# Patient Record
Sex: Male | Born: 1980
Health system: Southern US, Community
[De-identification: ages and names within clinical notes are randomized; demographics above are authoritative.]

## PROBLEM LIST (undated history)

## (undated) DIAGNOSIS — M549 Dorsalgia, unspecified: Secondary | ICD-10-CM

## (undated) DIAGNOSIS — G8929 Other chronic pain: Secondary | ICD-10-CM

## (undated) HISTORY — PX: NO PAST SURGERIES: SHX2092

---

## 2012-03-18 ENCOUNTER — Ambulatory Visit (INDEPENDENT_AMBULATORY_CARE_PROVIDER_SITE_OTHER): Payer: 59 | Admitting: Emergency Medicine

## 2012-03-18 VITALS — BP 118/82 | HR 107 | Temp 98.6°F | Resp 16 | Ht 73.0 in | Wt 214.0 lb

## 2012-03-18 DIAGNOSIS — M5430 Sciatica, unspecified side: Secondary | ICD-10-CM

## 2012-03-18 MED ORDER — NAPROXEN SODIUM 550 MG PO TABS: 550.0000 mg | ORAL_TABLET | Freq: Two times a day (BID) | ORAL | Status: DC

## 2012-03-18 MED ORDER — TRAMADOL HCL 50 MG PO TABS: 50.0000 mg | ORAL_TABLET | Freq: Four times a day (QID) | ORAL | Status: AC | PRN

## 2012-03-18 NOTE — Patient Instructions (Signed)

## 2012-03-18 NOTE — Progress Notes (Signed)
   Date:  03/18/2012   Name:  Lance Moreno   DOB:  06-Jun-1981   MRN:  [id number] Gender: male Age: 31 y.o.  PCP:  No primary provider on file.    Chief Complaint: Back Pain   History of Present Illness:  Lance Moreno is a 31 y.o. pleasant patient who presents with the following:  4 month duration pain in low back and left buttock.  Says no history of injury or overuse.  Pain constant and unrelenting.  Worse now than previously.  Says pain radiates into the left hip laterally.  No paresthesias or weakness, no radiation of pain in leg.  Worse with bending, sneezing and coughing.  No factors improve pain.  Never evaluated.   There is no problem list on file for this patient.   No past medical history on file.  No past surgical history on file.  History  Substance Use Topics  . Smoking status: Current Everyday Smoker    Types: Cigarettes  . Smokeless tobacco: Not on file   Comment: 1 pack a week  . Alcohol Use: Not on file    No family history on file.  No Known Allergies  Medication list has been reviewed and updated.  No current outpatient prescriptions on file prior to visit.    Review of Systems:  As per HPI, otherwise negative.    Physical Examination: Filed Vitals:   03/18/12 1052  BP: 118/82  Pulse: 107  Temp: 98.6 F (37 C)  Resp: 16   Filed Vitals:   03/18/12 1052  Height: 6\' 1"  (1.854 m)  Weight: 214 lb (97.07 kg)   Body mass index is 28.23 kg/(m^2). Ideal Body Weight: Weight in (lb) to have BMI = 25: 189.1    GEN: WDWN, NAD, Non-toxic, Alert & Oriented x 3 HEENT: Atraumatic, Normocephalic.  Ears and Nose: No external deformity. EXTR: No clubbing/cyanosis/edema Back:  Tender left sciatic notch. NEURO: Normal gait.  DTR's lower ext 2+ PSYCH: Normally interactive. Conversant. Not depressed or anxious appearing.  Calm demeanor.    Assessment and Plan: Sciatic neuralgia Anaprox Ultram Follow up in two weeks  If still having pain  will likely do an MRI  Carmelina Dane, MD

## 2012-03-19 ENCOUNTER — Emergency Department (HOSPITAL_COMMUNITY)
Admission: EM | Admit: 2012-03-19 | Discharge: 2012-03-19 | Discharge status: Against Medical Advice | Payer: 59 | Attending: Emergency Medicine | Admitting: Emergency Medicine

## 2012-03-19 DIAGNOSIS — Z0389 Encounter for observation for other suspected diseases and conditions ruled out: Secondary | ICD-10-CM | POA: Insufficient documentation

## 2012-04-12 ENCOUNTER — Other Ambulatory Visit: Payer: Self-pay | Admitting: Internal Medicine

## 2012-04-12 DIAGNOSIS — M545 Low back pain: Secondary | ICD-10-CM

## 2012-04-16 ENCOUNTER — Other Ambulatory Visit: Payer: 59

## 2012-04-17 ENCOUNTER — Ambulatory Visit
Admission: RE | Admit: 2012-04-17 | Discharge: 2012-04-17 | Disposition: A | Discharge status: Home / Self Care | Payer: 59 | Source: Ambulatory Visit | Attending: Internal Medicine | Admitting: Internal Medicine

## 2012-04-17 DIAGNOSIS — M545 Low back pain: Secondary | ICD-10-CM

## 2012-09-21 ENCOUNTER — Ambulatory Visit (INDEPENDENT_AMBULATORY_CARE_PROVIDER_SITE_OTHER): Payer: 59 | Admitting: Internal Medicine

## 2012-09-21 ENCOUNTER — Ambulatory Visit: Payer: 59

## 2012-09-21 VITALS — BP 113/80 | HR 100 | Temp 99.5°F | Resp 18 | Wt 207.0 lb

## 2012-09-21 DIAGNOSIS — M25559 Pain in unspecified hip: Secondary | ICD-10-CM

## 2012-09-21 DIAGNOSIS — G8929 Other chronic pain: Secondary | ICD-10-CM

## 2012-09-21 DIAGNOSIS — M25552 Pain in left hip: Secondary | ICD-10-CM

## 2012-09-21 MED ORDER — CYCLOBENZAPRINE HCL 10 MG PO TABS: 10.0000 mg | ORAL_TABLET | Freq: Every day | ORAL | Status: DC

## 2012-09-21 MED ORDER — PREDNISONE 20 MG PO TABS: ORAL_TABLET | ORAL | Status: DC

## 2012-09-21 MED ORDER — MELOXICAM 15 MG PO TABS: 15.0000 mg | ORAL_TABLET | Freq: Every day | ORAL | Status: DC

## 2012-09-21 NOTE — Progress Notes (Signed)
  Subjective:    Patient ID: Lance Moreno, male    DOB: 07-15-1981, 32 y.o.   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 No prior hip injuries or back injuries    Review of Systems No fever chills or night sweats No urinary problems No muscle atrophy No diarrhea or constipation    Objective:   Physical Exam Vital signs stable Nontender to palpation of the lumbar area/spine straight without muscle spasm Is tender deep in the left buttock along the ishial tuberosity Chest pain with range of motion of the left hip in particular external rotation/nontender over the trochanteric bursa/nontender over the SI joint Straight leg raise to 90 produces pain in the hip and not the low back This started last August when he was in a running program and had acute pain in the left buttock He improved but Reinjuring himself     [facility] reading (PRIMARY) by  Dr. Merla Riches no fracture/no chronic changes/no avulsion  injuries   Assessment & Plan:  Problem #1 left hip pain/left buttock pain--chronic recurrent  ? Piriformis syndrome/? Sacroiliitis/? 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 predniSONE (DELTASONE) 20 MG tablet    Sig: 4/3/3/3/2/2/2/1/1/1single daily dose for 10days    Dispense:  22 tablet    Refill:  0  . cyclobenzaprine (FLEXERIL) 10 MG tablet    Sig: Take 1 tablet (10 mg total) by mouth at bedtime.    Dispense:  30 tablet    Refill:   0  . meloxicam (MOBIC) 15 MG tablet    Sig: Take 1 tablet (15 mg total) by mouth daily.    Dispense:  30 tablet    Refill:  0   Call in 10 days if not improving for referral to orthopedics and consideration of MRI At this point he has no time for physical therapy

## 2012-10-15 ENCOUNTER — Other Ambulatory Visit: Payer: Self-pay | Admitting: Internal Medicine

## 2012-10-15 DIAGNOSIS — M5430 Sciatica, unspecified side: Secondary | ICD-10-CM

## 2012-10-16 ENCOUNTER — Ambulatory Visit
Admission: RE | Admit: 2012-10-16 | Discharge: 2012-10-16 | Disposition: A | Discharge status: Home / Self Care | Payer: 59 | Source: Ambulatory Visit | Attending: Internal Medicine | Admitting: Internal Medicine

## 2012-10-16 DIAGNOSIS — M5430 Sciatica, unspecified side: Secondary | ICD-10-CM

## 2012-10-16 MED ORDER — IOHEXOL 180 MG/ML  SOLN
1.0000 mL | Freq: Once | INTRAMUSCULAR | Status: AC | PRN
  Administered 2012-10-16: 1 mL via EPIDURAL

## 2012-10-16 MED ORDER — METHYLPREDNISOLONE ACETATE 40 MG/ML INJ SUSP (RADIOLOG
120.0000 mg | Freq: Once | INTRAMUSCULAR | Status: AC
  Administered 2012-10-16: 120 mg via EPIDURAL

## 2012-10-28 ENCOUNTER — Other Ambulatory Visit: Payer: Self-pay | Admitting: Internal Medicine

## 2012-10-28 DIAGNOSIS — M549 Dorsalgia, unspecified: Secondary | ICD-10-CM

## 2012-10-31 ENCOUNTER — Ambulatory Visit
Admission: RE | Admit: 2012-10-31 | Discharge: 2012-10-31 | Disposition: A | Discharge status: Home / Self Care | Payer: 59 | Source: Ambulatory Visit | Attending: Internal Medicine | Admitting: Internal Medicine

## 2012-10-31 DIAGNOSIS — M549 Dorsalgia, unspecified: Secondary | ICD-10-CM

## 2012-10-31 MED ORDER — IOHEXOL 180 MG/ML  SOLN
1.0000 mL | Freq: Once | INTRAMUSCULAR | Status: AC | PRN
  Administered 2012-10-31: 1 mL via EPIDURAL

## 2012-10-31 MED ORDER — METHYLPREDNISOLONE ACETATE 40 MG/ML INJ SUSP (RADIOLOG
120.0000 mg | Freq: Once | INTRAMUSCULAR | Status: AC
  Administered 2012-10-31: 120 mg via EPIDURAL

## 2012-11-18 ENCOUNTER — Other Ambulatory Visit: Payer: Self-pay | Admitting: Internal Medicine

## 2012-11-18 DIAGNOSIS — M545 Low back pain: Secondary | ICD-10-CM

## 2012-11-20 ENCOUNTER — Ambulatory Visit
Admission: RE | Admit: 2012-11-20 | Discharge: 2012-11-20 | Disposition: A | Discharge status: Home / Self Care | Payer: 59 | Source: Ambulatory Visit | Attending: Internal Medicine | Admitting: Internal Medicine

## 2012-11-20 DIAGNOSIS — M545 Low back pain: Secondary | ICD-10-CM

## 2012-12-02 ENCOUNTER — Other Ambulatory Visit: Payer: Self-pay | Admitting: Internal Medicine

## 2012-12-02 DIAGNOSIS — M5030 Other cervical disc degeneration, unspecified cervical region: Secondary | ICD-10-CM

## 2012-12-05 ENCOUNTER — Ambulatory Visit
Admission: RE | Admit: 2012-12-05 | Discharge: 2012-12-05 | Disposition: A | Discharge status: Home / Self Care | Payer: 59 | Source: Ambulatory Visit | Attending: Internal Medicine | Admitting: Internal Medicine

## 2012-12-05 DIAGNOSIS — M5030 Other cervical disc degeneration, unspecified cervical region: Secondary | ICD-10-CM

## 2012-12-05 MED ORDER — IOHEXOL 180 MG/ML  SOLN
1.0000 mL | Freq: Once | INTRAMUSCULAR | Status: AC | PRN
  Administered 2012-12-05: 1 mL via EPIDURAL

## 2012-12-05 MED ORDER — METHYLPREDNISOLONE ACETATE 40 MG/ML INJ SUSP (RADIOLOG
120.0000 mg | Freq: Once | INTRAMUSCULAR | Status: AC
  Administered 2012-12-05: 120 mg via EPIDURAL

## 2012-12-10 ENCOUNTER — Other Ambulatory Visit (HOSPITAL_COMMUNITY): Payer: Self-pay | Admitting: Orthopaedic Surgery

## 2012-12-18 NOTE — Pre-Procedure Instructions (Addendum)
Lance Moreno  12/18/2012   Your procedure is scheduled on:  Friday, June 13th  Report to [facility] at 0530 AM. Come to main entrance "A" and go to east elevators up to 3rd floor. Check in at short stay desk.  Call this number if you have problems the morning of surgery: [phone]   Remember:   Do not eat food or drink liquids after midnight.   Take these medicines the morning of surgery with A SIP OF WATER: ultram if needed Stop taking Aspirin, Coumadin, Plavix, Effient, and herbal medications. Do not take any NSAIDs ie: Ibuprofen, Advil, Naproxen or any medication containing Aspirin.  Do not wear jewelry.  Do not wear lotions, powders, or perfume,deodorant.  Do not shave 48 hours prior to surgery. Men may shave face and neck.  Do not bring valuables to the hospital.  [facility] is not responsible   for any belongings or valuables.  Contacts, dentures or bridgework may not be worn into surgery.  Leave suitcase in the car. After surgery it may be brought to your room.  For patients admitted to the hospital, checkout time is 11:00 AM the day of discharge.   Patients discharged the day of surgery will not be allowed to drive home.    Special Instructions: Shower using CHG 2 nights before surgery and the night before surgery.  If you shower the day of surgery use CHG.  Use special wash - you have one bottle of CHG for all showers.  You should use approximately 1/3 of the bottle for each shower.   Please read over the following fact sheets that you were given: Pain Booklet, Coughing and Deep Breathing, MRSA Information and Surgical Site Infection Prevention

## 2012-12-19 ENCOUNTER — Encounter (HOSPITAL_COMMUNITY): Payer: Self-pay

## 2012-12-19 ENCOUNTER — Encounter (HOSPITAL_COMMUNITY)
Admission: RE | Admit: 2012-12-19 | Discharge: 2012-12-19 | Disposition: A | Discharge status: Home / Self Care | Payer: 59 | Source: Ambulatory Visit | Attending: Orthopaedic Surgery | Admitting: Orthopaedic Surgery

## 2012-12-19 DIAGNOSIS — Z01812 Encounter for preprocedural laboratory examination: Secondary | ICD-10-CM | POA: Insufficient documentation

## 2012-12-19 DIAGNOSIS — Z01811 Encounter for preprocedural respiratory examination: Secondary | ICD-10-CM | POA: Insufficient documentation

## 2012-12-19 HISTORY — DX: Other chronic pain: G89.29

## 2012-12-19 HISTORY — DX: Dorsalgia, unspecified: M54.9

## 2012-12-19 LAB — CBC
HCT: 46.6 % (ref 39.0–52.0)
Hemoglobin: 15.9 g/dL (ref 13.0–17.0)
MCH: 30.7 pg (ref 26.0–34.0)
MCV: 90 fL (ref 78.0–100.0)
Platelets: 266 10*3/uL (ref 150–400)
RBC: 5.18 MIL/uL (ref 4.22–5.81)
WBC: 8.4 10*3/uL (ref 4.0–10.5)

## 2012-12-19 LAB — COMPREHENSIVE METABOLIC PANEL
BUN: 9 mg/dL (ref 6–23)
CO2: 27 mEq/L (ref 19–32)
Calcium: 9.7 mg/dL (ref 8.4–10.5)
Chloride: 102 mEq/L (ref 96–112)
Creatinine, Ser: 0.9 mg/dL (ref 0.50–1.35)
GFR calc Af Amer: >90 mL/min (ref >90)
GFR calc non Af Amer: >90 mL/min (ref >90)
Glucose, Bld: 96 mg/dL (ref 70–99)
Total Bilirubin: 0.7 mg/dL (ref 0.3–1.2)

## 2012-12-19 NOTE — Progress Notes (Signed)
Pt denies SOB, chest pain, and being under the care of a cardiologist.  

## 2012-12-23 NOTE — H&P (Signed)
[facility]   A Division of [employer] [facility]   [address], [place], [place] [address] Telephone: [phone]  Fax: [phone]     PATIENT: Salif, Tay   MR#: [id number]  DOB: Jul 22, 1980      CHIEF COMPLAINT: back pain and left leg pain and weakness.  HISTORY OF PRESENT ILLNESS:  A 32 year old male self-referred with severe back pain, disc herniation large with radiculopathy, calf weakness, inability to stand, pain that wakes him up at night and he is here for consideration for surgical intervention.  He has had 2 MRI scans, 1 on 04/17/2012 which showed large disc herniation with moderate central stenosis at L4-5 and large disc herniation L5-S1.  MRI scan a  year later done on 11/30/2012 showed large paracentral disc herniation with mass effect on the left side of the thecal sac and left L5 nerve root compression.  The disc is larger with greater mass effect.  Right L4 nerve is normal.  L5-S1 shows large disc protrusion with interval desiccation and slightly decrease in size but there is still mild-to-moderate mass affect on the thecal sac contracting both S1 nerve roots worse on the left than right.   Patient is normally followed by Dr. Rodrigo Ran.     CURRENT MEDICATIONS:  Tramadol.   ALLERGIES:  None.   PAST SURGICAL HISTORY:  No previous surgeries.   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   FAMILY HISTORY:  Noncontributory.   REVIEW OF SYSTEMS:  A 14-point review of systems is negative for rheumatologic conditions.   PHYSICAL EXAMINATION:  Patient is 6 feet 2 inches, 195 pounds.  He is in moderate distress.  He is ambulatory with inability to ambulate on his toes.  There is gastrocsoleus atrophy on the left only, none on the right.  He can do single stance toe raises along the right.  He fatigues out after  almost a single on the left.  Gastrocsoleus is weak to manual testing.  There is some plantar foot and less significant dorsal foot numbness.  Anterior EHL tib is weak on the left, normal on the right.  Gastrocsoleus is weak on the left, normal on the right.  Quads, abductors are normal.  Normal hip range of motion, positive popliteal compression test, positive straight leg raising at 45 degrees.   RADIOGRAPHS/TEST:  MRI scan is reviewed with him which shows large disc compression.     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  He has had epidurals x3, [facility], 20 chiropractic therapy visits.  He has been on pain medications, anti-inflammatories, muscle relaxants, prednisone Dosepak, meloxicam, Flexeril, tramadol,traction,  all without relief.   PLAN:  At this point, I discussed with him that I do not think further nonoperative treatment is likely to help.  Plan would be left L5-S1 and left 4-5 microdiscectomy.  The 4-5 is most likely the more severe level.  Procedure discussed with patient.  Overnight stay in hospital.  Postoperative walking program.  Risks of surgery discussed including recurrent disc herniation, reoperation, need for lateral fusion, infection, dural tear, spinal fluid leakage, damage to the great vessels, risk of DVT discussed.  All questions answered and he requested we proceed.   For additional information please see handwritten notes, reports, orders and prescriptions in this chart.      Loraine Leriche  Becky Sax, M.D.

## 2012-12-26 MED ORDER — CEFAZOLIN SODIUM-DEXTROSE 2-3 GM-% IV SOLR
2.0000 g | INTRAVENOUS | Status: AC
  Administered 2012-12-27: 2 g via INTRAVENOUS
  Filled 2012-12-26: qty 50

## 2012-12-26 MED ORDER — CHLORHEXIDINE GLUCONATE 4 % EX LIQD: 60.0000 mL | Freq: Once | CUTANEOUS | Status: DC

## 2012-12-27 ENCOUNTER — Encounter (HOSPITAL_COMMUNITY): Payer: Self-pay | Admitting: Anesthesiology

## 2012-12-27 ENCOUNTER — Ambulatory Visit (HOSPITAL_COMMUNITY): Payer: 59

## 2012-12-27 ENCOUNTER — Ambulatory Visit (HOSPITAL_COMMUNITY): Payer: 59 | Admitting: Anesthesiology

## 2012-12-27 ENCOUNTER — Encounter (HOSPITAL_COMMUNITY): Payer: Self-pay | Admitting: *Deleted

## 2012-12-27 ENCOUNTER — Observation Stay (HOSPITAL_COMMUNITY)
Admission: RE | Admit: 2012-12-27 | Discharge: 2012-12-28 | Disposition: A | Discharge status: Home / Self Care | Payer: 59 | Source: Ambulatory Visit | Attending: Orthopaedic Surgery | Admitting: Orthopaedic Surgery

## 2012-12-27 ENCOUNTER — Encounter (HOSPITAL_COMMUNITY)
Admission: RE | Disposition: A | Discharge status: Home / Self Care | Payer: Self-pay | Source: Ambulatory Visit | Attending: Orthopaedic Surgery

## 2012-12-27 DIAGNOSIS — F17200 Nicotine dependence, unspecified, uncomplicated: Secondary | ICD-10-CM | POA: Insufficient documentation

## 2012-12-27 DIAGNOSIS — M5126 Other intervertebral disc displacement, lumbar region: Principal | ICD-10-CM | POA: Diagnosis present

## 2012-12-27 HISTORY — PX: LUMBAR LAMINECTOMY: SHX95

## 2012-12-27 SURGERY — MICRODISCECTOMY LUMBAR LAMINECTOMY
Anesthesia: General | Site: Back | Laterality: Left | Wound class: Clean

## 2012-12-27 MED ORDER — SENNOSIDES-DOCUSATE SODIUM 8.6-50 MG PO TABS: 1.0000 | ORAL_TABLET | Freq: Every evening | ORAL | Status: DC | PRN

## 2012-12-27 MED ORDER — METHOCARBAMOL 100 MG/ML IJ SOLN
500.0000 mg | Freq: Four times a day (QID) | INTRAVENOUS | Status: DC | PRN
  Filled 2012-12-27: qty 5

## 2012-12-27 MED ORDER — ACETAMINOPHEN 650 MG RE SUPP: 650.0000 mg | RECTAL | Status: DC | PRN

## 2012-12-27 MED ORDER — SODIUM CHLORIDE 0.9 % IV SOLN: 250.0000 mL | INTRAVENOUS | Status: DC

## 2012-12-27 MED ORDER — MIDAZOLAM HCL 5 MG/5ML IJ SOLN
INTRAMUSCULAR | Status: DC | PRN
  Administered 2012-12-27: 2 mg via INTRAVENOUS

## 2012-12-27 MED ORDER — LACTATED RINGERS IV SOLN
INTRAVENOUS | Status: DC | PRN
  Administered 2012-12-27 (×2): via INTRAVENOUS

## 2012-12-27 MED ORDER — ONDANSETRON HCL 4 MG/2ML IJ SOLN: 4.0000 mg | Freq: Once | INTRAMUSCULAR | Status: DC | PRN

## 2012-12-27 MED ORDER — SODIUM CHLORIDE 0.9 % IJ SOLN: 3.0000 mL | Freq: Two times a day (BID) | INTRAMUSCULAR | Status: DC

## 2012-12-27 MED ORDER — LIDOCAINE HCL (CARDIAC) 20 MG/ML IV SOLN
INTRAVENOUS | Status: DC | PRN
  Administered 2012-12-27: 100 mg via INTRAVENOUS

## 2012-12-27 MED ORDER — MORPHINE SULFATE 2 MG/ML IJ SOLN
1.0000 mg | INTRAMUSCULAR | Status: DC | PRN
  Administered 2012-12-27 (×3): 2 mg via INTRAVENOUS
  Filled 2012-12-27 (×3): qty 1

## 2012-12-27 MED ORDER — DOCUSATE SODIUM 100 MG PO CAPS
100.0000 mg | ORAL_CAPSULE | Freq: Two times a day (BID) | ORAL | Status: DC
  Administered 2012-12-27 – 2012-12-28 (×2): 100 mg via ORAL
  Filled 2012-12-27 (×3): qty 1

## 2012-12-27 MED ORDER — OXYCODONE-ACETAMINOPHEN 5-325 MG PO TABS: 1.0000 | ORAL_TABLET | ORAL | Status: DC | PRN

## 2012-12-27 MED ORDER — OXYCODONE-ACETAMINOPHEN 5-325 MG PO TABS: 1.0000 | ORAL_TABLET | ORAL | Status: AC | PRN

## 2012-12-27 MED ORDER — BISACODYL 10 MG RE SUPP: 10.0000 mg | Freq: Every day | RECTAL | Status: DC | PRN

## 2012-12-27 MED ORDER — THROMBIN 20000 UNITS EX SOLR
CUTANEOUS | Status: AC
  Filled 2012-12-27: qty 20000

## 2012-12-27 MED ORDER — KETOROLAC TROMETHAMINE 30 MG/ML IJ SOLN
INTRAMUSCULAR | Status: AC
  Filled 2012-12-27: qty 1

## 2012-12-27 MED ORDER — TRAMADOL HCL 50 MG PO TABS: 50.0000 mg | ORAL_TABLET | Freq: Four times a day (QID) | ORAL | Status: DC | PRN

## 2012-12-27 MED ORDER — HYDROMORPHONE HCL PF 1 MG/ML IJ SOLN
INTRAMUSCULAR | Status: AC
  Filled 2012-12-27: qty 1

## 2012-12-27 MED ORDER — METHOCARBAMOL 500 MG PO TABS
500.0000 mg | ORAL_TABLET | Freq: Four times a day (QID) | ORAL | Status: DC | PRN
  Filled 2012-12-27: qty 1

## 2012-12-27 MED ORDER — METHOCARBAMOL 500 MG PO TABS: 500.0000 mg | ORAL_TABLET | Freq: Four times a day (QID) | ORAL | Status: AC | PRN

## 2012-12-27 MED ORDER — CEFAZOLIN SODIUM 1-5 GM-% IV SOLN
1.0000 g | Freq: Three times a day (TID) | INTRAVENOUS | Status: AC
  Administered 2012-12-27 – 2012-12-28 (×2): 1 g via INTRAVENOUS
  Filled 2012-12-27 (×2): qty 50

## 2012-12-27 MED ORDER — BUPIVACAINE HCL (PF) 0.25 % IJ SOLN
INTRAMUSCULAR | Status: AC
  Filled 2012-12-27: qty 30

## 2012-12-27 MED ORDER — SODIUM CHLORIDE 0.9 % IJ SOLN: 3.0000 mL | INTRAMUSCULAR | Status: DC | PRN

## 2012-12-27 MED ORDER — ACETAMINOPHEN 325 MG PO TABS: 650.0000 mg | ORAL_TABLET | ORAL | Status: DC | PRN

## 2012-12-27 MED ORDER — ONDANSETRON HCL 4 MG/2ML IJ SOLN: 4.0000 mg | INTRAMUSCULAR | Status: DC | PRN

## 2012-12-27 MED ORDER — HYDROCODONE-ACETAMINOPHEN 5-325 MG PO TABS: 1.0000 | ORAL_TABLET | ORAL | Status: DC | PRN

## 2012-12-27 MED ORDER — LIDOCAINE HCL 4 % MT SOLN
OROMUCOSAL | Status: DC | PRN
  Administered 2012-12-27: 4 mL via TOPICAL

## 2012-12-27 MED ORDER — MEPERIDINE HCL 25 MG/ML IJ SOLN: 6.2500 mg | INTRAMUSCULAR | Status: DC | PRN

## 2012-12-27 MED ORDER — KETOROLAC TROMETHAMINE 30 MG/ML IJ SOLN
30.0000 mg | Freq: Four times a day (QID) | INTRAMUSCULAR | Status: AC
  Administered 2012-12-27 – 2012-12-28 (×4): 30 mg via INTRAVENOUS
  Filled 2012-12-27 (×3): qty 1

## 2012-12-27 MED ORDER — OXYCODONE HCL 5 MG/5ML PO SOLN: 5.0000 mg | Freq: Once | ORAL | Status: DC | PRN

## 2012-12-27 MED ORDER — PANTOPRAZOLE SODIUM 40 MG IV SOLR
40.0000 mg | Freq: Every day | INTRAVENOUS | Status: DC
  Administered 2012-12-27: 40 mg via INTRAVENOUS
  Filled 2012-12-27 (×2): qty 40

## 2012-12-27 MED ORDER — ZOLPIDEM TARTRATE 5 MG PO TABS: 5.0000 mg | ORAL_TABLET | Freq: Every evening | ORAL | Status: DC | PRN

## 2012-12-27 MED ORDER — GLYCOPYRROLATE 0.2 MG/ML IJ SOLN
INTRAMUSCULAR | Status: DC | PRN
  Administered 2012-12-27: 0.6 mg via INTRAVENOUS

## 2012-12-27 MED ORDER — PROPOFOL 10 MG/ML IV BOLUS
INTRAVENOUS | Status: DC | PRN
  Administered 2012-12-27: 200 mg via INTRAVENOUS

## 2012-12-27 MED ORDER — MENTHOL 3 MG MT LOZG: 1.0000 | LOZENGE | OROMUCOSAL | Status: DC | PRN

## 2012-12-27 MED ORDER — PHENOL 1.4 % MT LIQD: 1.0000 | OROMUCOSAL | Status: DC | PRN

## 2012-12-27 MED ORDER — KCL IN DEXTROSE-NACL 20-5-0.45 MEQ/L-%-% IV SOLN
INTRAVENOUS | Status: DC
  Administered 2012-12-27: 16:00:00 via INTRAVENOUS
  Filled 2012-12-27 (×3): qty 1000

## 2012-12-27 MED ORDER — OXYCODONE HCL 5 MG PO TABS: 5.0000 mg | ORAL_TABLET | Freq: Once | ORAL | Status: DC | PRN

## 2012-12-27 MED ORDER — SUFENTANIL CITRATE 50 MCG/ML IV SOLN
INTRAVENOUS | Status: DC | PRN
  Administered 2012-12-27: 5 ug via INTRAVENOUS
  Administered 2012-12-27 (×3): 10 ug via INTRAVENOUS
  Administered 2012-12-27: 15 ug via INTRAVENOUS

## 2012-12-27 MED ORDER — HYDROMORPHONE HCL PF 1 MG/ML IJ SOLN
0.2500 mg | INTRAMUSCULAR | Status: DC | PRN
  Administered 2012-12-27 (×2): 0.5 mg via INTRAVENOUS

## 2012-12-27 MED ORDER — FLEET ENEMA 7-19 GM/118ML RE ENEM: 1.0000 | ENEMA | Freq: Once | RECTAL | Status: AC | PRN

## 2012-12-27 MED ORDER — ROCURONIUM BROMIDE 100 MG/10ML IV SOLN
INTRAVENOUS | Status: DC | PRN
  Administered 2012-12-27: 50 mg via INTRAVENOUS
  Administered 2012-12-27 (×2): 10 mg via INTRAVENOUS

## 2012-12-27 MED ORDER — NEOSTIGMINE METHYLSULFATE 1 MG/ML IJ SOLN
INTRAMUSCULAR | Status: DC | PRN
  Administered 2012-12-27: 5 mg via INTRAVENOUS

## 2012-12-27 MED ORDER — 0.9 % SODIUM CHLORIDE (POUR BTL) OPTIME
TOPICAL | Status: DC | PRN
  Administered 2012-12-27: 1000 mL

## 2012-12-27 MED ORDER — ONDANSETRON HCL 4 MG/2ML IJ SOLN
INTRAMUSCULAR | Status: DC | PRN
  Administered 2012-12-27: 4 mg via INTRAVENOUS

## 2012-12-27 SURGICAL SUPPLY — 47 items
BUR ROUND FLUTED 4 SOFT TCH (BURR) ×2 IMPLANT
CLOTH BEACON ORANGE TIMEOUT ST (SAFETY) ×2 IMPLANT
CORDS BIPOLAR (ELECTRODE) ×2 IMPLANT
COVER SURGICAL LIGHT HANDLE (MISCELLANEOUS) ×2 IMPLANT
DERMABOND ADVANCED (GAUZE/BANDAGES/DRESSINGS) ×1
DERMABOND ADVANCED .7 DNX12 (GAUZE/BANDAGES/DRESSINGS) ×1 IMPLANT
DRAPE MICROSCOPE LEICA (MISCELLANEOUS) ×2 IMPLANT
DRAPE PROXIMA HALF (DRAPES) ×4 IMPLANT
DRSG EMULSION OIL 3X3 NADH (GAUZE/BANDAGES/DRESSINGS) ×2 IMPLANT
DRSG MEPILEX BORDER 4X4 (GAUZE/BANDAGES/DRESSINGS) IMPLANT
DRSG MEPILEX BORDER 4X8 (GAUZE/BANDAGES/DRESSINGS) ×2 IMPLANT
DURAPREP 26ML APPLICATOR (WOUND CARE) ×2 IMPLANT
ELECT REM PT RETURN 9FT ADLT (ELECTROSURGICAL) ×2
ELECTRODE REM PT RTRN 9FT ADLT (ELECTROSURGICAL) ×1 IMPLANT
GLOVE BIOGEL PI IND STRL 7.5 (GLOVE) ×1 IMPLANT
GLOVE BIOGEL PI IND STRL 8 (GLOVE) ×1 IMPLANT
GLOVE BIOGEL PI INDICATOR 7.5 (GLOVE) ×1
GLOVE BIOGEL PI INDICATOR 8 (GLOVE) ×1
GLOVE ECLIPSE 7.0 STRL STRAW (GLOVE) ×2 IMPLANT
GLOVE ORTHO TXT STRL SZ7.5 (GLOVE) ×2 IMPLANT
GOWN PREVENTION PLUS LG XLONG (DISPOSABLE) IMPLANT
GOWN STRL NON-REIN LRG LVL3 (GOWN DISPOSABLE) ×6 IMPLANT
KIT BASIN OR (CUSTOM PROCEDURE TRAY) ×2 IMPLANT
KIT ROOM TURNOVER OR (KITS) ×2 IMPLANT
MANIFOLD NEPTUNE II (INSTRUMENTS) ×2 IMPLANT
NDL SUT .5 MAYO 1.404X.05X (NEEDLE) ×1 IMPLANT
NEEDLE HYPO 25GX1X1/2 BEV (NEEDLE) ×2 IMPLANT
NEEDLE MAYO TAPER (NEEDLE) ×1
NEEDLE SPNL 18GX3.5 QUINCKE PK (NEEDLE) ×2 IMPLANT
NS IRRIG 1000ML POUR BTL (IV SOLUTION) ×2 IMPLANT
PACK LAMINECTOMY ORTHO (CUSTOM PROCEDURE TRAY) ×2 IMPLANT
PAD ARMBOARD 7.5X6 YLW CONV (MISCELLANEOUS) ×4 IMPLANT
PATTIES SURGICAL .5 X.5 (GAUZE/BANDAGES/DRESSINGS) ×2 IMPLANT
PATTIES SURGICAL .75X.75 (GAUZE/BANDAGES/DRESSINGS) IMPLANT
SPONGE GAUZE 4X4 12PLY (GAUZE/BANDAGES/DRESSINGS) ×2 IMPLANT
SUT VIC AB 0 CT1 27 (SUTURE) ×1
SUT VIC AB 0 CT1 27XBRD ANBCTR (SUTURE) ×1 IMPLANT
SUT VIC AB 2-0 CT1 27 (SUTURE) ×1
SUT VIC AB 2-0 CT1 TAPERPNT 27 (SUTURE) ×1 IMPLANT
SUT VICRYL 0 TIES 12 18 (SUTURE) ×2 IMPLANT
SUT VICRYL 4-0 PS2 18IN ABS (SUTURE) IMPLANT
SUT VICRYL AB 2 0 TIES (SUTURE) ×2 IMPLANT
SYR 20ML ECCENTRIC (SYRINGE) IMPLANT
SYR CONTROL 10ML LL (SYRINGE) ×2 IMPLANT
TOWEL OR 17X24 6PK STRL BLUE (TOWEL DISPOSABLE) ×2 IMPLANT
TOWEL OR 17X26 10 PK STRL BLUE (TOWEL DISPOSABLE) ×2 IMPLANT
WATER STERILE IRR 1000ML POUR (IV SOLUTION) ×2 IMPLANT

## 2012-12-27 NOTE — Anesthesia Preprocedure Evaluation (Addendum)
Anesthesia Evaluation  Patient identified by MRN, date of birth, ID band Patient awake    Reviewed: Allergy & Precautions, H&P , NPO status   History of Anesthesia Complications Negative for: history of anesthetic complications  Airway Mallampati: II TM Distance: >3 FB Neck ROM: Full    Dental  (+) Teeth Intact   Pulmonary neg pulmonary ROS, Current Smoker,  Approx 3 cig/daily   Pulmonary exam normal       Cardiovascular negative cardio ROS  Rhythm:Regular Rate:Normal     Neuro/Psych negative psych ROS   GI/Hepatic negative GI ROS, Neg liver ROS,   Endo/Other  negative endocrine ROS  Renal/GU negative Renal ROS     Musculoskeletal negative musculoskeletal ROS (+)   Abdominal Normal abdominal exam  (+)   Peds  Hematology negative hematology ROS (+)   Anesthesia Other Findings   Reproductive/Obstetrics negative OB ROS                          Anesthesia Physical Anesthesia Plan  ASA: II  Anesthesia Plan: General   Post-op Pain Management:    Induction: Intravenous  Airway Management Planned: Oral ETT  Additional Equipment:   Intra-op Plan:   Post-operative Plan: Extubation in OR  Informed Consent: I have reviewed the patients History and Physical, chart, labs and discussed the procedure including the risks, benefits and alternatives for the proposed anesthesia with the patient or authorized representative who has indicated his/her understanding and acceptance.   Dental advisory given  Plan Discussed with: CRNA  Anesthesia Plan Comments:        Anesthesia Quick Evaluation

## 2012-12-27 NOTE — Anesthesia Procedure Notes (Signed)
Procedure Name: Intubation Date/Time: 12/27/2012 7:33 AM Performed by: Coralee Rud Pre-anesthesia Checklist: Patient identified, Emergency Drugs available, Suction available and Patient being monitored Patient Re-evaluated:Patient Re-evaluated prior to inductionOxygen Delivery Method: Circle system utilized Preoxygenation: Pre-oxygenation with 100% oxygen Intubation Type: IV induction and Inhalational induction Ventilation: Mask ventilation without difficulty Laryngoscope Size: Miller and 3 Grade View: Grade I Tube type: Oral Tube size: 8.0 mm Number of attempts: 1 Airway Equipment and Method: Stylet and LTA kit utilized Placement Confirmation: ETT inserted through vocal cords under direct vision,  breath sounds checked- equal and bilateral and positive ETCO2 Secured at: 23 cm Tube secured with: Tape Dental Injury: Teeth and Oropharynx as per pre-operative assessment

## 2012-12-27 NOTE — Brief Op Note (Cosign Needed)
12/27/2012  9:10 AM  PATIENT:  Lance Moreno  32 y.o. male  PRE-OPERATIVE DIAGNOSIS:  Left L4-5, L5-S1 HNP  POST-OPERATIVE DIAGNOSIS:  Left L4-5, L5-S1 HNP  PROCEDURE:  Procedure(s) with comments: MICRODISCECTOMY LUMBAR LAMINECTOMY (Left) - Left L4-5, Left L5-S1 Microdiscectomy  SURGEON:  Surgeon(s) and Role:    * Eldred Manges, MD - Primary  PHYSICIAN ASSISTANT: Maud Deed [facility]  ASSISTANTS: none   ANESTHESIA:   general  EBL:  Total I/O In: 1400 [I.V.:1400] Out: 50 [Blood:50]  BLOOD ADMINISTERED:none  DRAINS: none   LOCAL MEDICATIONS USED:  NONE  SPECIMEN:  No Specimen  DISPOSITION OF SPECIMEN:  N/A  COUNTS:  YES  TOURNIQUET:  * No tourniquets in log *  DICTATION: .Note written in EPIC  PLAN OF CARE: Admit for overnight observation  PATIENT DISPOSITION:  PACU - hemodynamically stable.   Delay start of Pharmacological VTE agent (>24hrs) due to surgical blood loss or risk of bleeding: yes

## 2012-12-27 NOTE — Transfer of Care (Signed)
Immediate Anesthesia Transfer of Care Note  Patient: Lance Moreno  Procedure(s) Performed: Procedure(s) with comments: MICRODISCECTOMY LUMBAR LAMINECTOMY (Left) - Left L4-5, Left L5-S1 Microdiscectomy  Patient Location: PACU  Anesthesia Type:General  Level of Consciousness: awake, alert , oriented and patient cooperative  Airway & Oxygen Therapy: Patient Spontanous Breathing and Patient connected to nasal cannula oxygen  Post-op Assessment: Report given to PACU RN, Post -op Vital signs reviewed and stable and Patient moving all extremities  Post vital signs: Reviewed and stable  Complications: No apparent anesthesia complications

## 2012-12-27 NOTE — Interval H&P Note (Signed)
History and Physical Interval Note:  12/27/2012 7:13 AM  Lance Moreno  has presented today for surgery, with the diagnosis of Left L4-5, L5-S1 HNP  The various methods of treatment have been discussed with the patient and family. After consideration of risks, benefits and other options for treatment, the patient has consented to  Procedure(s) with comments: MICRODISCECTOMY LUMBAR LAMINECTOMY (Left) - Left L4-5, Left L5-S1 Microdiscectomy as a surgical intervention .  The patient's history has been reviewed, patient examined, no change in status, stable for surgery.  I have reviewed the patient's chart and labs.  Questions were answered to the patient's satisfaction.     Moraima Burd C

## 2012-12-27 NOTE — Progress Notes (Signed)
UR COMPLETED  

## 2012-12-27 NOTE — Anesthesia Postprocedure Evaluation (Signed)
Anesthesia Post Note  Patient: Lance Moreno  Procedure(s) Performed: Procedure(s) (LRB): MICRODISCECTOMY LUMBAR LAMINECTOMY (Left)  Anesthesia type: general  Patient location: PACU  Post pain: Pain level controlled  Post assessment: Patient's Cardiovascular Status Stable  Last Vitals:  Filed Vitals:   12/27/12 1107  BP: 137/76  Pulse: 73  Temp: 37.2 C  Resp: 16    Post vital signs: Reviewed and stable  Level of consciousness: sedated  Complications: No apparent anesthesia complications

## 2012-12-27 NOTE — Preoperative (Signed)
Beta Blockers   Reason not to administer Beta Blockers:Not Applicable 

## 2012-12-28 NOTE — Progress Notes (Signed)
Subjective: 1 Day Post-Op Procedure(s) (LRB): MICRODISCECTOMY LUMBAR LAMINECTOMY (Left) Patient reports pain as mild.    Objective: Vital signs in last 24 hours: Temp:  [98.5 F (36.9 C)-99.2 F (37.3 C)] 98.5 F (36.9 C) (06/14 0545) Pulse Rate:  [72] 72 (06/14 0545) Resp:  [16-20] 20 (06/14 0545) BP: (132-136)/(60-80) 132/60 mmHg (06/14 0545) SpO2:  [99 %-100 %] 100 % (06/14 0545)  Intake/Output from previous day: 06/13 0701 - 06/14 0700 In: 1400 [I.V.:1400] Out: 50 [Blood:50] Intake/Output this shift:    No results found for this basename: HGB,  in the last 72 hours No results found for this basename: WBC, RBC, HCT, PLT,  in the last 72 hours No results found for this basename: NA, K, CL, CO2, BUN, CREATININE, GLUCOSE, CALCIUM,  in the last 72 hours No results found for this basename: LABPT, INR,  in the last 72 hours  Neurologically intact  Assessment/Plan: 1 Day Post-Op Procedure(s) (LRB): MICRODISCECTOMY LUMBAR LAMINECTOMY (Left) Plan discharge Home. Good relief of leg pain.   YATES,MARK C 12/28/2012, 12:21 PM

## 2012-12-28 NOTE — Op Note (Signed)
NAMEMarland Moreno  Lance, Moreno NO.:  [id number]  MEDICAL RECORD NO.:  [id number]  LOCATION:  [id number]                        FACILITY:  [facility]  PHYSICIAN:  Emyah Roznowski C. Ophelia Charter, M.D.    DATE OF BIRTH:  1981/03/17  DATE OF PROCEDURE:  12/27/2012 DATE OF DISCHARGE:                              OPERATIVE REPORT   PREOPERATIVE DIAGNOSIS:  Left L4-5, left L5-S1 herniated nucleus pulposus with radiculopathy.  PROCEDURE:  Left L4-5, left L5-S1 microdiskectomy (2-level).  SURGEON:  Shayle Donahoo C. Ophelia Charter, MD  ASSISTANT:  Wende Neighbors, PA-C, medically necessary and present for the entire procedure.   ANESTHESIA:  GOT plus Marcaine skin local.  EBL:  50 mL.  DRAINS:  None.  COMPLICATIONS:  None.  INDICATION FOR PROCEDURE:  This is a 32 year old male, who has had greater than 6 months of persistent low back pain.  He had previous MRI back in 2013 which showed large disk herniation with central stenosis in October 2013.  Large disk herniation L5-S1 also paracentral left.  He had persistent symptoms, failed conservative treatment including therapy, epidural steroids, activity modification, and has had persistent left leg weakness.  Due to large disk at both levels, it appeared that the 4-5 level was likely symptomatic level causing his weakness, however, with significant large disk protrusion at 5-1, it was felt best to do a 2-level procedure.  After induction of general anesthesia, orotracheal intubation, Ancef prophylaxis prone positioning, careful padding, shoulder pads, ulnar nerve pads, elbows at 90/ 90 on chest rolls, back was prepped with DuraPrep.  Time-out procedure was completed, was dried, area squared with towels.  Betadine Steri-Drape applied and laminectomy sheet.  Cross- table lateral x-ray with a spinal needle at 4-5 and another spinal needle at L5-S1 confirmed appropriate level.  Midline incision was made. Subperiosteal dissection on the lateral side.  Taylor retractor  placed at 4-5 level which was done first.  Once chunks of ligament were removed, there was very narrow interlaminar space and some shingling overhanging slightly.  This was trimmed back with the bur and also narrow distance between the right and left pedicle.  Facet had some overhang, which was not as well appreciated on the preoperative MRI. Thick chunks of ligament were removed.  Disk was contained but the posterior longitudinal ligament had extremely large bulge which was central and left.  Annulus was incised and continued to work with Epstein up and down, micro pituitaries, straight and up-biting regular pituitaries until finally chunk of disk came loose from the midline after developing a plane with the Epstein curette and the big chunk of disk was removed.  This incidentally left the nerve root freed, loose, and continued removal until the disk was flat.  Foramina was loose. Hockey stick could be passed 180 degrees anterior without compression. Nerve root was free.  Some veins in the gutter were coagulated with the bipolar cautery.  Microscope was then adjusted, came down to the 5-1 level and procedure was repeated at this level.  The disk was not quite as prominent.  The patient had at the 4-5 level greater than 50% of canal covered due to the large disk protrusion.  At the 5-1 level, it protruded about a 3rd  of the space into the canal and was primarily at the midline, some more to the left.  Epstein curettes were used, hockey stick.  D'Errico protected the nerve root and overhanging spurs were removed.  Nerve root was free and this disk appeared more chronic and was not as bulging and not as spongy at the 4-5 level.  Once it was decompressed and flat, nerve root was free.  Both areas were irrigated. Bipolar was used for veins in the gutter throughout the case to keep the operative field dry.  Nerve roots were freed.  Dura was intact. Irrigation with saline, standard layered  closure with 0 Vicryl in the deep fascia, 2-0 in the subcutaneous tissue, subcuticular Vicryl closure, and then postop dressing.  Instrument count and needle count was correct.  The patient tolerated the procedure well.     Yuriko Portales C. Ophelia Charter, M.D.     MCY/MEDQ  D:  12/27/2012  T:  12/28/2012  Job:  [id number]

## 2012-12-28 NOTE — Progress Notes (Signed)
UR Completed Trammell Bowden Graves-Bigelow, RN,BSN [phone]  

## 2012-12-31 ENCOUNTER — Encounter (HOSPITAL_COMMUNITY): Payer: Self-pay | Admitting: Orthopaedic Surgery

## 2013-01-10 NOTE — Discharge Summary (Signed)
Physician Discharge Summary  Patient ID: Lance Moreno MRN: [id number] DOB/AGE: [birth year] 32 y.o.  Admit date: 12/27/2012 Discharge date: 01/10/2013  Admission Diagnoses:  HNP (herniated nucleus pulposus), lumbar left L4-5 and Left L5-S1  Discharge Diagnoses:  Principal Problem:   HNP (herniated nucleus pulposus), lumbar  left L4-5 and Left L5-S1  Past Medical History  Diagnosis Date  . Back pain, chronic     Hx: of sine 2013    Surgeries: Procedure(s): MICRODISCECTOMY LUMBAR LAMINECTOMY on 12/27/2012.left L4-5 and Left L5-S1   Consultants (if any):  none  Discharged Condition: Improved  Hospital Course: Lance Moreno is an 32 y.o. male who was admitted 12/27/2012 with a diagnosis of HNP (herniated nucleus pulposus), lumbar and went to the operating room on 12/27/2012 and underwent the above named procedures.    He was given perioperative antibiotics:  Anti-infectives   Start     Dose/Rate Route Frequency Ordered Stop   12/27/12 1600  ceFAZolin (ANCEF) IVPB 1 g/50 mL premix     1 g 100 mL/hr over 30 Minutes Intravenous Every 8 hours 12/27/12 1123 12/28/12 0111   12/27/12 0600  ceFAZolin (ANCEF) IVPB 2 g/50 mL premix     2 g 100 mL/hr over 30 Minutes Intravenous On call to O.R. 12/26/12 1423 12/27/12 0730    Good relief of left leg pain post op.  Ambulating well in hallway.  He was given sequential compression devices, early ambulation for DVT prophylaxis.  He benefited maximally from the hospital stay and there were no complications.    Recent vital signs:  Filed Vitals:   12/28/12 0545  BP: 132/60  Pulse: 72  Temp: 98.5 F (36.9 C)  Resp: 20    Recent laboratory studies:  Lab Results  Component Value Date   HGB 15.9 12/19/2012   Lab Results  Component Value Date   WBC 8.4 12/19/2012   PLT 266 12/19/2012   No results found for this basename: INR   Lab Results  Component Value Date   NA 139 12/19/2012   K 4.8 12/19/2012   CL 102 12/19/2012   CO2 27  12/19/2012   BUN 9 12/19/2012   CREATININE 0.90 12/19/2012   GLUCOSE 96 12/19/2012    Discharge Medications:     Medication List    TAKE these medications       methocarbamol 500 MG tablet  Commonly known as:  ROBAXIN  Take 1 tablet (500 mg total) by mouth every 6 (six) hours as needed (spasm).     oxyCODONE-acetaminophen 5-325 MG per tablet  Commonly known as:  ROXICET  Take 1-2 tablets by mouth every 4 (four) hours as needed for pain.     traMADol 50 MG tablet  Commonly known as:  ULTRAM  Take 50 mg by mouth every 6 (six) hours as needed for pain.        Diagnostic Studies: Dg Lumbar Spine 2-3 Views  12/27/2012   *RADIOLOGY REPORT*  Clinical Data: Low back pain.  LUMBAR SPINE - 2-3 VIEW  Comparison: 12/05/2012.  Findings: Film #1 demonstrates a needle at L4-5 and L5-S1.  Film #2 demonstrates a probe at L4-L5.  IMPRESSION: L4-L5 marked.   Original Report Authenticated By: Davonna Belling, M.D.    Disposition: 01-Home or Self Care No lifting greater than 10 lbs. Avoid bending, stooping and twisting. Walk in house for first week them may start to get out slowly increasing distance up to one mile by 3 weeks post op. Keep incision dry for 3  days, may use tegaderm or similar water impervious dressing. Change dressing daily or as needed. Ice packs to back for pain and swelling       Follow-up Information   Follow up with YATES,MARK C, MD. Schedule an appointment as soon as possible for a visit in 2 weeks. (or as scheduled)    Contact information:   [address] [place] [address] [phone]        Signed: Wende Neighbors 01/10/2013, 10:13 AM

## 2013-08-01 ENCOUNTER — Ambulatory Visit (INDEPENDENT_AMBULATORY_CARE_PROVIDER_SITE_OTHER): Payer: 59 | Admitting: Internal Medicine

## 2013-08-01 VITALS — BP 100/70 | HR 109 | Temp 98.3°F | Resp 16 | Ht 73.0 in | Wt 206.0 lb

## 2013-08-01 DIAGNOSIS — R21 Rash and other nonspecific skin eruption: Secondary | ICD-10-CM

## 2013-08-01 DIAGNOSIS — L299 Pruritus, unspecified: Secondary | ICD-10-CM

## 2013-08-01 DIAGNOSIS — B86 Scabies: Secondary | ICD-10-CM

## 2013-08-01 LAB — POCT SKIN KOH: Skin KOH, POC: NEGATIVE

## 2013-08-01 MED ORDER — TRIAMCINOLONE ACETONIDE 0.1 % EX CREA: TOPICAL_CREAM | CUTANEOUS | Status: DC

## 2013-08-01 MED ORDER — IVERMECTIN 3 MG PO TABS: ORAL_TABLET | ORAL | Status: AC

## 2013-08-01 MED ORDER — PREDNISONE 10 MG PO TABS: ORAL_TABLET | ORAL | Status: AC

## 2013-08-01 NOTE — Progress Notes (Signed)
   Subjective:    Patient ID: Lance Moreno, male    DOB: 08/22/1980, 33 y.o.   MRN: [id number]  HPI Exposed to scabies, works in mental health, has rash and intense itching all over. Rash progressive for months.   Review of Systems     Objective:   Physical Exam  Classic scabies rash Creases, penis trunk scaly macular severe and has had for months. Results for orders placed in visit on 08/01/13  POCT SKIN KOH      Result Value Range   Skin KOH, POC Negative          Assessment & Plan:  Scabies likely Ivermectin/Prednisone/Cetirizine/Triamcinilone cream

## 2013-08-01 NOTE — Patient Instructions (Signed)
Scabies  Scabies are small bugs (mites) that burrow under the skin and cause red bumps and severe itching. These bugs can only be seen with a microscope. Scabies are highly contagious. They can spread easily from person to person by direct contact. They are also spread through sharing clothing or linens that have the scabies mites living in them. It is not unusual for an entire family to become infected through shared towels, clothing, or bedding.   HOME CARE INSTRUCTIONS   · Your caregiver may prescribe a cream or lotion to kill the mites. If cream is prescribed, massage the cream into the entire body from the neck to the bottom of both feet. Also massage the cream into the scalp and face if your child is less than 1 year old. Avoid the eyes and mouth. Do not wash your hands after application.  · Leave the cream on for 8 to 12 hours. Your child should bathe or shower after the 8 to 12 hour application period. Sometimes it is helpful to apply the cream to your child right before bedtime.  · One treatment is usually effective and will eliminate approximately 95% of infestations. For severe cases, your caregiver may decide to repeat the treatment in 1 week. Everyone in your household should be treated with one application of the cream.  · New rashes or burrows should not appear within 24 to 48 hours after successful treatment. However, the itching and rash may last for 2 to 4 weeks after successful treatment. Your caregiver may prescribe a medicine to help with the itching or to help the rash go away more quickly.  · Scabies can live on clothing or linens for up to 3 days. All of your child's recently used clothing, towels, stuffed toys, and bed linens should be washed in hot water and then dried in a dryer for at least 20 minutes on high heat. Items that cannot be washed should be enclosed in a plastic bag for at least 3 days.  · To help relieve itching, bathe your child in a cool bath or apply cool washcloths to the  affected areas.  · Your child may return to school after treatment with the prescribed cream.  SEEK MEDICAL CARE IF:   · The itching persists longer than 4 weeks after treatment.  · The rash spreads or becomes infected. Signs of infection include red blisters or yellow-tan crust.  Document Released: 07/03/2005 Document Revised: 09/25/2011 Document Reviewed: 11/11/2008  [employer]® Patient Information ©2014 [employer].

## 2013-08-01 NOTE — Progress Notes (Signed)
   Subjective:    Patient ID: Lance Moreno, male    DOB: 1981/02/09, 33 y.o.   MRN: [id number]  HPI Patient presents with rash since October 2014.  Patient has rash on arms, back, legs, and back of knees.  Has used tea tree lotion helps with itching temporarily.   Review of Systems     Objective:   Physical Exam        Assessment & Plan:

## 2013-08-14 ENCOUNTER — Other Ambulatory Visit: Payer: Self-pay | Admitting: Internal Medicine

## 2013-08-16 ENCOUNTER — Other Ambulatory Visit: Payer: Self-pay | Admitting: Internal Medicine

## 2013-08-18 NOTE — Telephone Encounter (Signed)
Do you want to RF or RTC? 

## 2013-10-30 IMAGING — CR DG LUMBAR SPINE 2-3V
2 series · 2 of 2 positions shown · non-contrast
Comparison: 12/05/2012.

CLINICAL DATA: Low back pain.

LUMBAR SPINE - 2-3 VIEW

[lateral (1 of 2)]
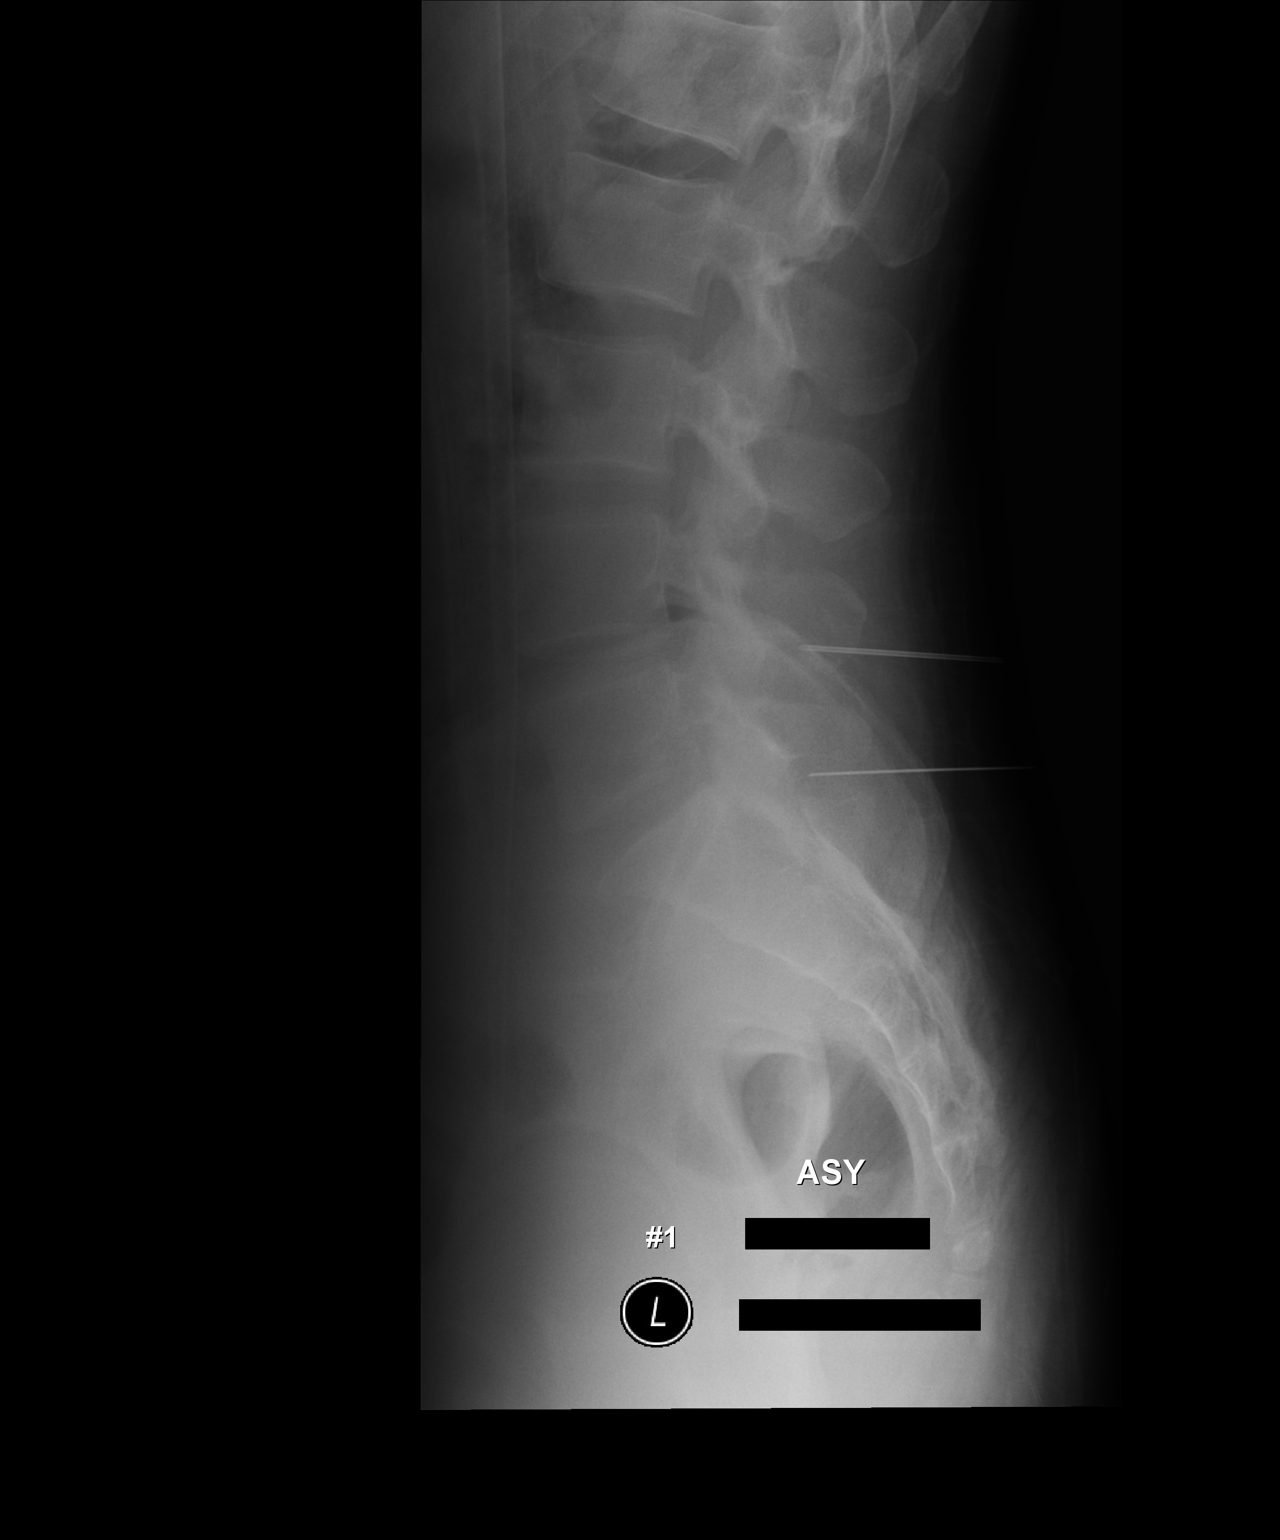

[lateral (2 of 2)]
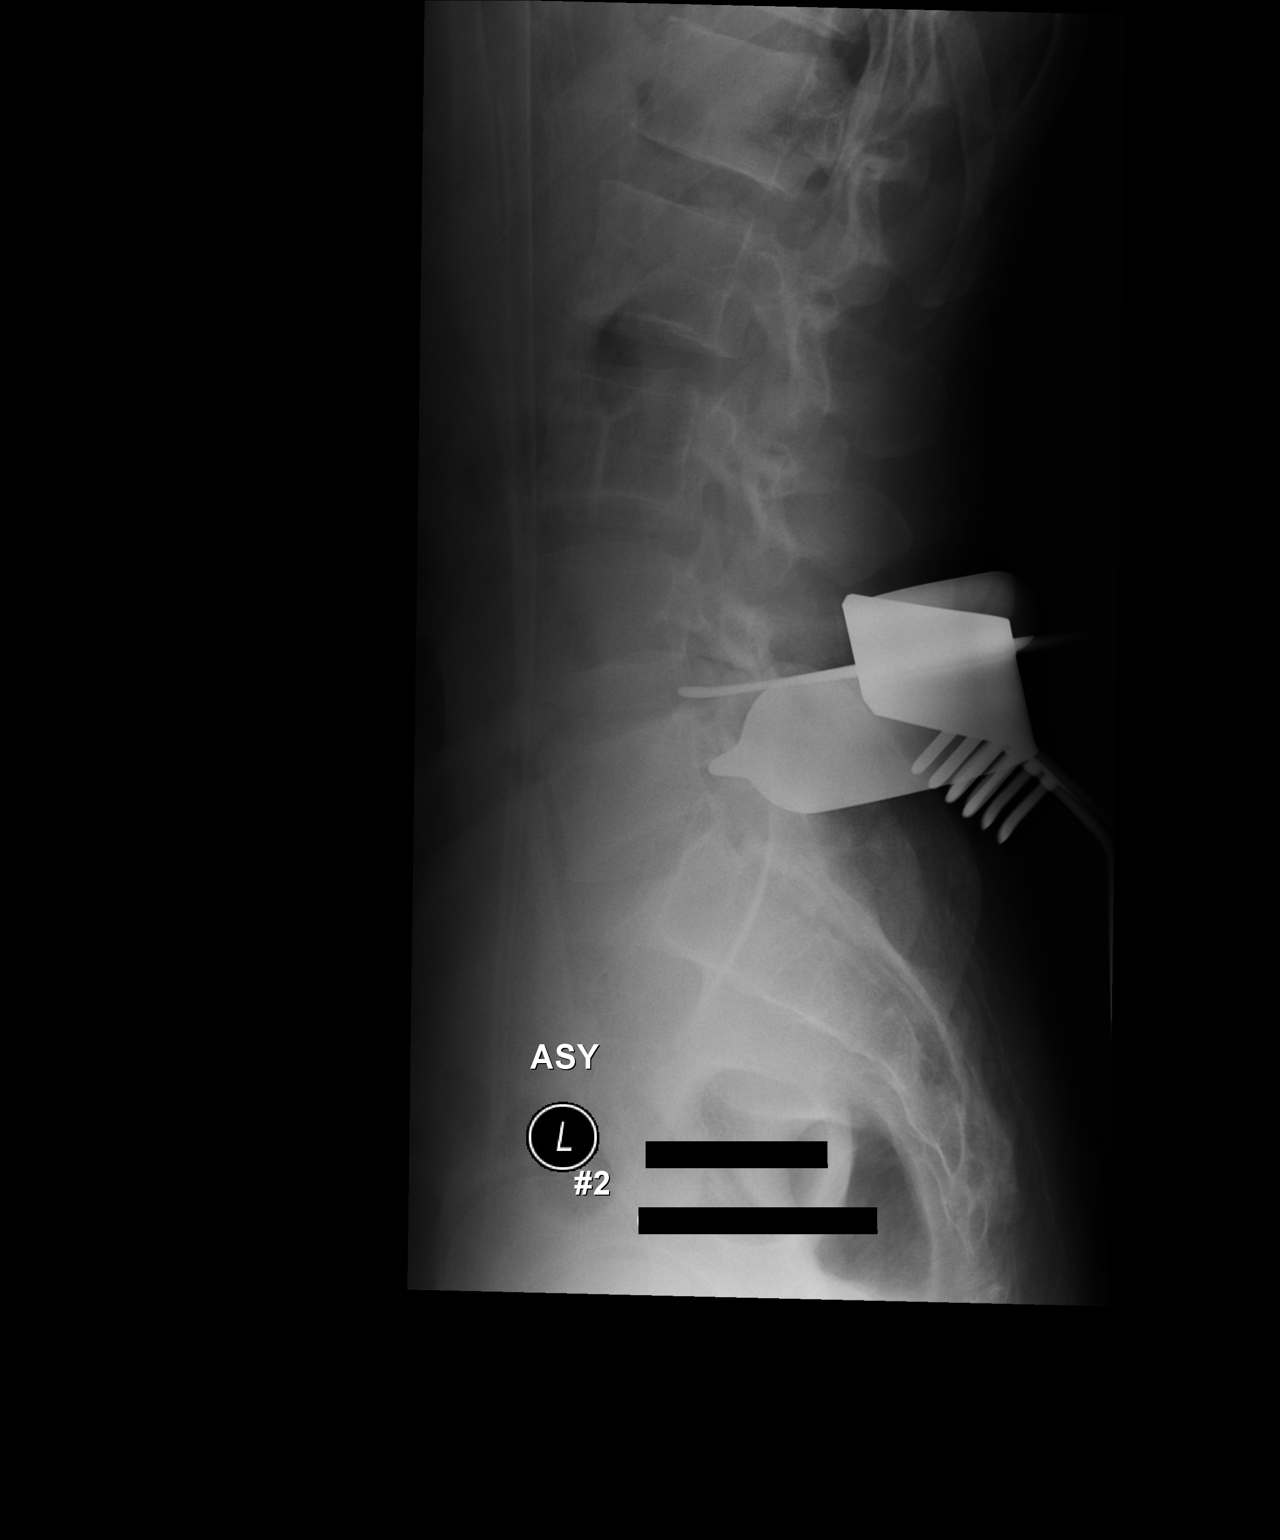

[2 of 2 positions shown; findings below may reference images not displayed]

FINDINGS: Film #1 demonstrates a needle at L4-5 and L5-S1.  Film #2
demonstrates a probe at L4-L5.
IMPRESSION: L4-L5 marked.

## 2013-12-17 ENCOUNTER — Other Ambulatory Visit: Payer: Self-pay | Admitting: Internal Medicine

## 2016-05-16 ENCOUNTER — Other Ambulatory Visit (INDEPENDENT_AMBULATORY_CARE_PROVIDER_SITE_OTHER): Payer: Self-pay | Admitting: *Deleted

## 2016-05-16 NOTE — Telephone Encounter (Signed)
Pt. Called asking for a muscle relaxer. Pt. Callback number is [phone]

## 2016-05-16 NOTE — Telephone Encounter (Signed)
Please advise 

## 2016-05-17 MED ORDER — METHOCARBAMOL 500 MG PO TABS: 500.0000 mg | ORAL_TABLET | Freq: Two times a day (BID) | ORAL | 0 refills | Status: AC | PRN

## 2016-05-17 NOTE — Telephone Encounter (Signed)
Ok robaxin 500mg   One po bid prn spasms # 30

## 2016-05-17 NOTE — Telephone Encounter (Signed)
Called script to pharmacy

## 2017-03-06 ENCOUNTER — Other Ambulatory Visit (INDEPENDENT_AMBULATORY_CARE_PROVIDER_SITE_OTHER): Payer: Self-pay | Admitting: Orthopaedic Surgery

## 2017-03-06 NOTE — Telephone Encounter (Signed)
Ok for refill? 

## 2017-03-07 NOTE — Telephone Encounter (Signed)
Ok thanks 

## 2017-09-10 DIAGNOSIS — Z1389 Encounter for screening for other disorder: Secondary | ICD-10-CM | POA: Diagnosis not present

## 2017-09-10 DIAGNOSIS — E7849 Other hyperlipidemia: Secondary | ICD-10-CM | POA: Diagnosis not present

## 2017-09-10 DIAGNOSIS — Z0000 Encounter for general adult medical examination without abnormal findings: Secondary | ICD-10-CM | POA: Diagnosis not present

## 2017-09-10 DIAGNOSIS — R82998 Other abnormal findings in urine: Secondary | ICD-10-CM | POA: Diagnosis not present

## 2017-09-10 DIAGNOSIS — M5430 Sciatica, unspecified side: Secondary | ICD-10-CM | POA: Diagnosis not present

## 2018-08-18 DIAGNOSIS — J0140 Acute pansinusitis, unspecified: Secondary | ICD-10-CM | POA: Diagnosis not present
